# Patient Record
Sex: Female | Born: 1995 | Race: White | Hispanic: No | Marital: Single | Smoking: Never smoker
Health system: Southern US, Community
[De-identification: ages and names within clinical notes are randomized; demographics above are authoritative.]

## PROBLEM LIST (undated history)

## (undated) DIAGNOSIS — N200 Calculus of kidney: Secondary | ICD-10-CM

## (undated) DIAGNOSIS — F32A Depression, unspecified: Secondary | ICD-10-CM

## (undated) DIAGNOSIS — F329 Major depressive disorder, single episode, unspecified: Secondary | ICD-10-CM

## (undated) DIAGNOSIS — F419 Anxiety disorder, unspecified: Secondary | ICD-10-CM

## (undated) DIAGNOSIS — F4010 Social phobia, unspecified: Secondary | ICD-10-CM

---
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
## 2016-01-15 ENCOUNTER — Emergency Department: Payer: Medicaid Other

## 2016-01-15 ENCOUNTER — Emergency Department
Admission: EM | Admit: 2016-01-15 | Discharge: 2016-01-15 | Disposition: A | Discharge status: Home / Self Care | Payer: Medicaid Other | Attending: Emergency Medicine | Admitting: Emergency Medicine

## 2016-01-15 DIAGNOSIS — Z791 Long term (current) use of non-steroidal anti-inflammatories (NSAID): Secondary | ICD-10-CM | POA: Insufficient documentation

## 2016-01-15 DIAGNOSIS — Z5181 Encounter for therapeutic drug level monitoring: Secondary | ICD-10-CM | POA: Diagnosis not present

## 2016-01-15 DIAGNOSIS — N200 Calculus of kidney: Secondary | ICD-10-CM

## 2016-01-15 DIAGNOSIS — R1030 Lower abdominal pain, unspecified: Secondary | ICD-10-CM | POA: Diagnosis present

## 2016-01-15 DIAGNOSIS — N202 Calculus of kidney with calculus of ureter: Secondary | ICD-10-CM | POA: Insufficient documentation

## 2016-01-15 LAB — CBC
HCT: 41.3 % (ref 35.0–47.0)
Hemoglobin: 14.3 g/dL (ref 12.0–16.0)
MCH: 30.3 pg (ref 26.0–34.0)
MCHC: 34.6 g/dL (ref 32.0–36.0)
MCV: 87.6 fL (ref 80.0–100.0)
PLATELETS: 312 10*3/uL (ref 150–440)
RBC: 4.71 MIL/uL (ref 3.80–5.20)
RDW: 13.1 % (ref 11.5–14.5)
WBC: 11.8 10*3/uL — ABNORMAL HIGH (ref 3.6–11.0)

## 2016-01-15 LAB — POCT PREGNANCY, URINE: PREG TEST UR: NEGATIVE

## 2016-01-15 LAB — COMPREHENSIVE METABOLIC PANEL
ALT: 12 U/L — AB (ref 14–54)
AST: 22 U/L (ref 15–41)
Albumin: 5.2 g/dL — ABNORMAL HIGH (ref 3.5–5.0)
Alkaline Phosphatase: 64 U/L (ref 38–126)
Anion gap: 11 (ref 5–15)
BUN: 13 mg/dL (ref 6–20)
CHLORIDE: 106 mmol/L (ref 101–111)
CO2: 22 mmol/L (ref 22–32)
CREATININE: 0.88 mg/dL (ref 0.44–1.00)
Calcium: 10.2 mg/dL (ref 8.9–10.3)
GFR calc Af Amer: >60 mL/min (ref >60)
GLUCOSE: 113 mg/dL — AB (ref 65–99)
Potassium: 3.7 mmol/L (ref 3.5–5.1)
SODIUM: 139 mmol/L (ref 135–145)
Total Bilirubin: 0.2 mg/dL — ABNORMAL LOW (ref 0.3–1.2)
Total Protein: 8 g/dL (ref 6.5–8.1)

## 2016-01-15 LAB — URINE DRUG SCREEN, QUALITATIVE (ARMC ONLY)
Amphetamines, Ur Screen: NOT DETECTED
BARBITURATES, UR SCREEN: NOT DETECTED
Benzodiazepine, Ur Scrn: NOT DETECTED
COCAINE METABOLITE, UR ~~LOC~~: NOT DETECTED
Cannabinoid 50 Ng, Ur ~~LOC~~: POSITIVE — AB
MDMA (ECSTASY) UR SCREEN: NOT DETECTED
METHADONE SCREEN, URINE: NOT DETECTED
Opiate, Ur Screen: NOT DETECTED
Phencyclidine (PCP) Ur S: NOT DETECTED
TRICYCLIC, UR SCREEN: NOT DETECTED

## 2016-01-15 LAB — URINALYSIS COMPLETE WITH MICROSCOPIC (ARMC ONLY)
BILIRUBIN URINE: NEGATIVE
Bacteria, UA: NONE SEEN
GLUCOSE, UA: NEGATIVE mg/dL
Hgb urine dipstick: NEGATIVE
Leukocytes, UA: NEGATIVE
Nitrite: NEGATIVE
Protein, ur: 30 mg/dL — AB
Specific Gravity, Urine: 1.048 — ABNORMAL HIGH (ref 1.005–1.030)
pH: 5 (ref 5.0–8.0)

## 2016-01-15 MED ORDER — ONDANSETRON HCL 4 MG/2ML IJ SOLN
4.0000 mg | Freq: Once | INTRAMUSCULAR | Status: AC
  Administered 2016-01-15: 4 mg via INTRAVENOUS
  Filled 2016-01-15: qty 2

## 2016-01-15 MED ORDER — ONDANSETRON HCL 4 MG PO TABS: 4.0000 mg | ORAL_TABLET | Freq: Every day | ORAL | 0 refills | Status: AC | PRN

## 2016-01-15 MED ORDER — MORPHINE SULFATE (PF) 4 MG/ML IV SOLN
4.0000 mg | Freq: Once | INTRAVENOUS | Status: AC
  Administered 2016-01-15: 4 mg via INTRAVENOUS
  Filled 2016-01-15: qty 1

## 2016-01-15 MED ORDER — TAMSULOSIN HCL 0.4 MG PO CAPS: 0.4000 mg | ORAL_CAPSULE | Freq: Every day | ORAL | 0 refills | Status: AC

## 2016-01-15 MED ORDER — SODIUM CHLORIDE 0.9 % IV BOLUS (SEPSIS)
1000.0000 mL | Freq: Once | INTRAVENOUS | Status: AC
  Administered 2016-01-15: 1000 mL via INTRAVENOUS

## 2016-01-15 MED ORDER — HYDROCODONE-ACETAMINOPHEN 5-325 MG PO TABS: 1.0000 | ORAL_TABLET | ORAL | 0 refills | Status: AC | PRN

## 2016-01-15 NOTE — ED Provider Notes (Signed)
[facility] Emergency Department Provider Note  Time seen: 1:49 PM  I have reviewed the triage vital signs and the nursing notes.   HISTORY  Chief Complaint Back Pain and Nausea    HPI Monica Hammond is a 20 y.o. female with a past medical history of anxiety, depression, ureteral lithiasis, who presents the emergency department with sudden onset left flank pain. According to the patient approximately 3 hours ago she developed sudden onset sharp severe left flank pain as well as suprapubic pain. Patient states this feels nearly identical to the past kidney stone she experienced approximately 9 months ago. States some nausea but denies vomiting. Denies diarrhea or fever. Denies dysuria or hematuria. Denies vaginal bleeding or discharge. Describes the pain as moderate to severe sharp, left flank pain.  Past Medical History:  Diagnosis Date  . Anxiety   . Depression   . Kidney stone   . Social anxiety disorder     There are no active problems to display for this patient.   History reviewed. No pertinent surgical history.  Prior to Admission medications   Medication Sig Start Date End Date Taking? Authorizing Provider  naproxen (NAPROSYN) 500 MG tablet Take 1 tablet (500 mg total) by mouth 2 (two) times daily with a meal. 04/29/15   Sharman Cheek, MD  ondansetron (ZOFRAN) 4 MG tablet Take 1 tablet (4 mg total) by mouth daily as needed for nausea or vomiting. Patient not taking: Reported on 04/29/2015 04/24/15 04/23/16  Darci Current, MD  oxyCODONE-acetaminophen (ROXICET) 5-325 MG tablet Take 1 tablet by mouth every 4 (four) hours as needed for severe pain. Patient not taking: Reported on 04/29/2015 04/24/15   Darci Current, MD  phenazopyridine (PYRIDIUM) 200 MG tablet Take 1 tablet (200 mg total) by mouth 3 (three) times daily as needed for pain. 04/29/15   Sharman Cheek, MD  ranitidine (ZANTAC) 150 MG capsule Take 1 capsule (150 mg total) by  mouth 2 (two) times daily. 04/29/15   Sharman Cheek, MD  tamsulosin (FLOMAX) 0.4 MG CAPS capsule Take 0.4 mg by mouth daily. For 5 days 04/25/15   Historical Provider, MD  tamsulosin (FLOMAX) 0.4 MG CAPS capsule Take 1 capsule (0.4 mg total) by mouth daily. 04/29/15   Sharman Cheek, MD    Allergies  Allergen Reactions  . Oxycodone-Acetaminophen Rash    No family history on file.  Social History Social History  Substance Use Topics  . Smoking status: Never Smoker  . Smokeless tobacco: Never Used  . Alcohol use No    Review of Systems Constitutional: Negative for fever. Cardiovascular: Negative for chest pain. Respiratory: Negative for shortness of breath. Gastrointestinal: Left flank pain. Genitourinary: Negative for dysuria. Musculoskeletal: Negative for back pain. Neurological: Negative for headaches, focal weakness or numbness. 10-point ROS otherwise negative.  ____________________________________________   PHYSICAL EXAM:  VITAL SIGNS: ED Triage Vitals [01/15/16 1205]  Enc Vitals Group     BP 122/82     Pulse Rate (!) 117     Resp (!) 22     Temp 97.6 F (36.4 C)     Temp Source Oral     SpO2 100 %     Weight 95 lb (43.1 kg)     Height      Head Circumference      Peak Flow      Pain Score 6     Pain Loc      Pain Edu?      Excl. in  GC?     Constitutional: Alert and oriented. Well appearing and in no distress. Eyes: Normal exam ENT   Head: Normocephalic and atraumatic.   Mouth/Throat: Mucous membranes are moist. Cardiovascular: Normal rate, regular rhythm. No murmur Respiratory: Normal respiratory effort without tachypnea nor retractions. Breath sounds are clear Gastrointestinal: Soft, mild left-sided abdominal tenderness palpation as well as suprapubic tenderness to palpation. No rebound or guarding. Moderate left CVA tenderness palpation. Musculoskeletal: Nontender with normal range of motion in all extremities.  Neurologic:  Normal  speech and language. No gross focal neurologic deficits are appreciated. Skin:  Skin is warm, dry and intact.  Psychiatric: Mood and affect are normal. Speech and behavior are normal.   ____________________________________________   RADIOLOGY  CT shows left distal ureteral stone  ____________________________________________   INITIAL IMPRESSION / ASSESSMENT AND PLAN / ED COURSE  Pertinent labs & imaging results that were available during my care of the patient were reviewed by me and considered in my medical decision making (see chart for details).  The patient presents emergency department with left flank pain starting this morning around 11:00. Currently states moderate sharp left-sided flank pain. Has a history of kidney stones with a 2 mm right UVJ stone diagnosed in November 2016. We will check labs, CT renal scan, treat pain and nausea and IV hydrate while awaiting results. Patient agreeable to plan.  CT consistent with left distal ureteral stone on my read. We will discharge with Norco, Zofran, and urology follow-up. I discussed return precautions with the patient is agreeable.  ____________________________________________   FINAL CLINICAL IMPRESSION(S) / ED DIAGNOSES  Left flank pain Ureterolithiasis   Minna Antis, MD 01/15/16 1512

## 2016-01-15 NOTE — ED Triage Notes (Addendum)
Pt arrives to ER via [facility] from home c/o mid lower back pain that began at 11PM yesterday. Pt denies any injury associated with pain. Pt states hx of this pain approx 1 month ago; seen at [facility] and dx with "possible ovarian cyst", pt reports abdominal pain associated with that episode. Pt reports that she took an Excedrin this AM and also one of her "dad's pills, has THC in it". Pt states that she takes her dad's prescribed pills approx once every 3-4 months because "it helps me sleep off pain and headaches". Took pill at 630AM today. Pt reports that the pill makes her feel "fuzzy headed". Nausea, no vomiting. Denies abdominal pain at this time. Pt restless while sitting in chair.  Pt reports pain is constant but intermittently "spreads". Pt alert and oriented X4, active, cooperative. RR even and unlabored, color WNL.

## 2016-01-15 NOTE — ED Notes (Signed)
Patient states that she started having lower back and left side pain today around 11 or 12. Patient has a history of kidney stones. Patient states that she is having some burning with urination. Denies vaginal bleeding or discharge.

## 2017-08-21 ENCOUNTER — Emergency Department
Admission: EM | Admit: 2017-08-21 | Discharge: 2017-08-21 | Disposition: A | Discharge status: Home / Self Care | Payer: Medicaid Other | Attending: Emergency Medicine | Admitting: Emergency Medicine

## 2017-08-21 ENCOUNTER — Encounter: Payer: Self-pay | Admitting: Emergency Medicine

## 2017-08-21 DIAGNOSIS — Z72820 Sleep deprivation: Secondary | ICD-10-CM | POA: Diagnosis not present

## 2017-08-21 DIAGNOSIS — R410 Disorientation, unspecified: Secondary | ICD-10-CM | POA: Insufficient documentation

## 2017-08-21 DIAGNOSIS — Z79899 Other long term (current) drug therapy: Secondary | ICD-10-CM | POA: Diagnosis not present

## 2017-08-21 DIAGNOSIS — R079 Chest pain, unspecified: Secondary | ICD-10-CM | POA: Diagnosis present

## 2017-08-21 LAB — CBC
HCT: 42.5 % (ref 35.0–47.0)
Hemoglobin: 14.1 g/dL (ref 12.0–16.0)
MCH: 31.2 pg (ref 26.0–34.0)
MCHC: 33.1 g/dL (ref 32.0–36.0)
MCV: 94.3 fL (ref 80.0–100.0)
PLATELETS: 342 10*3/uL (ref 150–440)
RBC: 4.5 MIL/uL (ref 3.80–5.20)
RDW: 12.8 % (ref 11.5–14.5)
WBC: 6.5 10*3/uL (ref 3.6–11.0)

## 2017-08-21 LAB — COMPREHENSIVE METABOLIC PANEL
ALT: 63 U/L — AB (ref 14–54)
ANION GAP: 9 (ref 5–15)
AST: 26 U/L (ref 15–41)
Albumin: 4.6 g/dL (ref 3.5–5.0)
Alkaline Phosphatase: 61 U/L (ref 38–126)
BUN: 8 mg/dL (ref 6–20)
CHLORIDE: 102 mmol/L (ref 101–111)
CO2: 26 mmol/L (ref 22–32)
Calcium: 9.5 mg/dL (ref 8.9–10.3)
Creatinine, Ser: 0.5 mg/dL (ref 0.44–1.00)
Glucose, Bld: 88 mg/dL (ref 65–99)
Potassium: 3.8 mmol/L (ref 3.5–5.1)
SODIUM: 137 mmol/L (ref 135–145)
Total Bilirubin: 0.7 mg/dL (ref 0.3–1.2)
Total Protein: 7.7 g/dL (ref 6.5–8.1)

## 2017-08-21 LAB — URINALYSIS, COMPLETE (UACMP) WITH MICROSCOPIC
BILIRUBIN URINE: NEGATIVE
Bacteria, UA: NONE SEEN
GLUCOSE, UA: NEGATIVE mg/dL
Hgb urine dipstick: NEGATIVE
Ketones, ur: NEGATIVE mg/dL
Leukocytes, UA: NEGATIVE
NITRITE: NEGATIVE
PROTEIN: NEGATIVE mg/dL
Specific Gravity, Urine: 1.006 (ref 1.005–1.030)
pH: 7 (ref 5.0–8.0)

## 2017-08-21 LAB — LIPASE, BLOOD: LIPASE: 32 U/L (ref 11–51)

## 2017-08-21 LAB — POCT PREGNANCY, URINE: Preg Test, Ur: NEGATIVE

## 2017-08-21 LAB — TROPONIN I

## 2017-08-21 NOTE — ED Provider Notes (Signed)
[facility] Emergency Department Provider Note  Time seen: 2:46 PM  I have reviewed the triage vital signs and the nursing notes.   HISTORY  Chief Complaint Abdominal Pain; Nausea; and Tingling    HPI Monica Hammond is a 22 y.o. female with a past medical history of anxiety, depression, presents to the emergency department for various complaints of chest discomfort abdominal cramping, difficulty speaking and confusion.  According to the patient she took Tylenol PM last night but could not fall asleep.  Around 1 or 2:00 in the morning she felt like she could not find the right words when she was trying to talk and felt somewhat confused.  States she is still not slept, this morning she felt nauseated, and had one episode of diarrhea.  Patient states she is experiencing some chest discomfort this morning somewhat into her left arm as well.  Denies any chest discomfort currently continues today mild abdominal cramping.  Denies any cough, congestion fever or dysuria.   Past Medical History:  Diagnosis Date  . Anxiety   . Depression   . Kidney stone   . Social anxiety disorder     There are no active problems to display for this patient.   History reviewed. No pertinent surgical history.  Prior to Admission medications   Medication Sig Start Date End Date Taking? Authorizing Provider  naproxen (NAPROSYN) 500 MG tablet Take 1 tablet (500 mg total) by mouth 2 (two) times daily with a meal. 04/29/15   Sharman Cheek, MD  ondansetron (ZOFRAN) 4 MG tablet Take 1 tablet (4 mg total) by mouth daily as needed for nausea or vomiting. 01/15/16   Minna Antis, MD  oxyCODONE-acetaminophen (ROXICET) 5-325 MG tablet Take 1 tablet by mouth every 4 (four) hours as needed for severe pain. Patient not taking: Reported on 04/29/2015 04/24/15   Darci Current, MD  phenazopyridine (PYRIDIUM) 200 MG tablet Take 1 tablet (200 mg total) by mouth 3 (three) times daily as  needed for pain. 04/29/15   Sharman Cheek, MD  ranitidine (ZANTAC) 150 MG capsule Take 1 capsule (150 mg total) by mouth 2 (two) times daily. 04/29/15   Sharman Cheek, MD  tamsulosin (FLOMAX) 0.4 MG CAPS capsule Take 1 capsule (0.4 mg total) by mouth daily. 01/15/16   Minna Antis, MD    Allergies  Allergen Reactions  . Oxycodone-Acetaminophen Rash    No family history on file.  Social History Social History   Tobacco Use  . Smoking status: Never Smoker  . Smokeless tobacco: Never Used  Substance Use Topics  . Alcohol use: No  . Drug use: No    Review of Systems Constitutional: Negative for fever. Eyes: Negative for visual complaints ENT: Negative for recent illness/congestion Cardiovascular: Mild chest discomfort this morning. Respiratory: Negative for shortness of breath. Gastrointestinal: Abdominal cramping.  Positive for nausea, negative for vomiting but one episode of diarrhea. Genitourinary: Negative for urinary compaints Musculoskeletal: Negative for musculoskeletal complaints Skin: Negative for skin complaints  Neurological: Negative for headache.  Negative for weakness or numbness.  States she was having trouble finding the right words this morning and felt somewhat confused. All other ROS negative  ____________________________________________   PHYSICAL EXAM:  VITAL SIGNS: ED Triage Vitals  Enc Vitals Group     BP 08/21/17 1324 113/69     Pulse Rate 08/21/17 1324 (!) 105     Resp 08/21/17 1324 20     Temp 08/21/17 1324 98.1 F (36.7 C)  Temp Source 08/21/17 1324 Oral     SpO2 08/21/17 1324 100 %     Weight 08/21/17 1325 110 lb (49.9 kg)     Height 08/21/17 1325 5\' 2"  (1.575 m)     Head Circumference --      Peak Flow --      Pain Score 08/21/17 1324 6     Pain Loc --      Pain Edu? --      Excl. in GC? --     Constitutional: Alert and oriented. Well appearing and in no distress. Eyes: Normal exam ENT   Head: Normocephalic and  atraumatic.   Mouth/Throat: Mucous membranes are moist. Cardiovascular: Normal rate, regular rhythm. No murmur Respiratory: Normal respiratory effort without tachypnea nor retractions. Breath sounds are clear Gastrointestinal: Soft, nondistended, thin abdomen.  States mild discomfort in all quadrants but no focal tenderness identified.  No rebound or guarding. Musculoskeletal: Nontender with normal range of motion in all extremities Neurologic:  Normal speech and language. No gross focal neurologic deficits.  Equal grip strengths.  No pronator drift.  Good lower extremity strength.  No difficulty ambulating. Skin:  Skin is warm, dry and intact.  Psychiatric: Somewhat anxious appearing.  ____________________________________________    EKG  EKG reviewed and interpreted by myself shows normal sinus rhythm at 96 bpm with a narrow QRS, normal axis, normal intervals, no ST changes.  Normal EKG.  ____________________________________________  INITIAL IMPRESSION / ASSESSMENT AND PLAN / ED COURSE  Pertinent labs & imaging results that were available during my care of the patient were reviewed by me and considered in my medical decision making (see chart for details).  Patient presents to the emergency department for various complaints of chest discomfort which is since resolved, abdominal cramping with nausea and one episode of diarrhea as well as difficulty finding the right words and feeling somewhat confused around 1 or 2:00 this morning.  Differential would include chest wall pain, ACS, anxiety, gastroenteritis, urinary tract infection, less likely CVA, more likely medication induced symptoms.  Patient admits to taking Tylenol PM but states she never could fall asleep, noted the difficulty speaking and confusion around 1 or 2:00 in the morning after taking the medication but still not sleeping.  I highly suspect the symptoms are likely medication induced.  Patient has a normal/intact  neurological exam currently.  We will check labs and continue to closely monitor in the emergency department.  Patient agreeable to this plan of care.  Patient's workup is essentially normal.  Urinalysis negative.  Pregnancy test negative.  Labs are within normal limits including negative troponin.  Reassuring EKG.  Highly suspect the patient's symptoms are due to taking a sleeping aid along with sleep deprivation.  I discussed with the patient obtaining plenty of rest today following up with her doctor.  I also discussed return precautions for any further weakness, numbness confusion or slurred speech.  Patient agreeable to plan.  ____________________________________________   FINAL CLINICAL IMPRESSION(S) / ED DIAGNOSES  Sleep deprivation Confusion    Minna AntisPaduchowski, Rithvik Orcutt, MD 08/21/17 1609

## 2017-08-21 NOTE — ED Triage Notes (Signed)
No facial drooping noted. Pt ambulatory with normal gait.

## 2017-08-21 NOTE — ED Triage Notes (Signed)
Pt reports last night at around 20:00 started with nausea, abdominal pain, tingling in her left arm and not being able to find words. Pt reports she also could not sleep last night and had some diarrhea. Pt denies SOB, CP, or other sx's.

## 2017-12-27 ENCOUNTER — Emergency Department
Admission: EM | Admit: 2017-12-27 | Discharge: 2017-12-27 | Disposition: A | Discharge status: Home / Self Care | Payer: Self-pay | Attending: Emergency Medicine | Admitting: Emergency Medicine

## 2017-12-27 ENCOUNTER — Encounter: Payer: Self-pay | Admitting: Emergency Medicine

## 2017-12-27 ENCOUNTER — Other Ambulatory Visit: Payer: Self-pay

## 2017-12-27 ENCOUNTER — Emergency Department: Payer: Self-pay

## 2017-12-27 DIAGNOSIS — R55 Syncope and collapse: Secondary | ICD-10-CM | POA: Insufficient documentation

## 2017-12-27 DIAGNOSIS — E876 Hypokalemia: Secondary | ICD-10-CM | POA: Insufficient documentation

## 2017-12-27 DIAGNOSIS — Z79899 Other long term (current) drug therapy: Secondary | ICD-10-CM | POA: Insufficient documentation

## 2017-12-27 LAB — URINALYSIS, COMPLETE (UACMP) WITH MICROSCOPIC
BACTERIA UA: NONE SEEN
Bilirubin Urine: NEGATIVE
GLUCOSE, UA: NEGATIVE mg/dL
Hgb urine dipstick: NEGATIVE
KETONES UR: NEGATIVE mg/dL
LEUKOCYTES UA: NEGATIVE
Nitrite: NEGATIVE
PROTEIN: NEGATIVE mg/dL
Specific Gravity, Urine: 1.012 (ref 1.005–1.030)
pH: 6 (ref 5.0–8.0)

## 2017-12-27 LAB — BASIC METABOLIC PANEL
ANION GAP: 10 (ref 5–15)
BUN: 9 mg/dL (ref 6–20)
CHLORIDE: 102 mmol/L (ref 98–111)
CO2: 24 mmol/L (ref 22–32)
Calcium: 9 mg/dL (ref 8.9–10.3)
Creatinine, Ser: 0.74 mg/dL (ref 0.44–1.00)
GFR calc Af Amer: >60 mL/min (ref >60)
GFR calc non Af Amer: >60 mL/min (ref >60)
Glucose, Bld: 128 mg/dL — ABNORMAL HIGH (ref 70–99)
POTASSIUM: 3 mmol/L — AB (ref 3.5–5.1)
SODIUM: 136 mmol/L (ref 135–145)

## 2017-12-27 LAB — CBC
HEMATOCRIT: 41 % (ref 35.0–47.0)
HEMOGLOBIN: 14.1 g/dL (ref 12.0–16.0)
MCH: 30.9 pg (ref 26.0–34.0)
MCHC: 34.3 g/dL (ref 32.0–36.0)
MCV: 90.2 fL (ref 80.0–100.0)
PLATELETS: 257 10*3/uL (ref 150–440)
RBC: 4.55 MIL/uL (ref 3.80–5.20)
RDW: 12.7 % (ref 11.5–14.5)
WBC: 7.9 10*3/uL (ref 3.6–11.0)

## 2017-12-27 LAB — TROPONIN I

## 2017-12-27 LAB — ACETAMINOPHEN LEVEL: ACETAMINOPHEN (TYLENOL), SERUM: 27 ug/mL (ref 10–30)

## 2017-12-27 LAB — POCT PREGNANCY, URINE
PREG TEST UR: NEGATIVE
PREG TEST UR: NEGATIVE

## 2017-12-27 MED ORDER — BACITRACIN ZINC 500 UNIT/GM EX OINT
TOPICAL_OINTMENT | Freq: Once | CUTANEOUS | Status: AC
  Administered 2017-12-27: 1 via TOPICAL
  Filled 2017-12-27: qty 0.9

## 2017-12-27 MED ORDER — POTASSIUM CHLORIDE 20 MEQ PO PACK
40.0000 meq | PACK | Freq: Two times a day (BID) | ORAL | Status: DC
  Administered 2017-12-27: 40 meq via ORAL
  Filled 2017-12-27: qty 2

## 2017-12-27 MED ORDER — ONDANSETRON HCL 4 MG/2ML IJ SOLN
INTRAMUSCULAR | Status: AC
  Administered 2017-12-27: 4 mg via INTRAVENOUS
  Filled 2017-12-27: qty 2

## 2017-12-27 MED ORDER — ONDANSETRON HCL 4 MG/2ML IJ SOLN
4.0000 mg | Freq: Once | INTRAMUSCULAR | Status: AC | PRN
  Administered 2017-12-27: 4 mg via INTRAVENOUS

## 2017-12-27 MED ORDER — AMOXICILLIN-POT CLAVULANATE 875-125 MG PO TABS: 1.0000 | ORAL_TABLET | Freq: Two times a day (BID) | ORAL | 0 refills | Status: AC

## 2017-12-27 MED ORDER — SODIUM CHLORIDE 0.9 % IV BOLUS
1000.0000 mL | Freq: Once | INTRAVENOUS | Status: AC
  Administered 2017-12-27: 1000 mL via INTRAVENOUS

## 2017-12-27 NOTE — ED Notes (Signed)
This RN introduced self to patient and her mother. Pt resting in bed. Medications administered per MD order. Pt denies any complaints at this time. Will continue to monitor for further patient needs, explained to patient/mother to call out when fluids are finished if fluids complete prior to this RN returning. Pt and mother state understanding.

## 2017-12-27 NOTE — ED Notes (Signed)
Dr. Brown at the bedside for pt evaluation 

## 2017-12-27 NOTE — ED Notes (Signed)
Pt to CT

## 2017-12-27 NOTE — ED Notes (Signed)
NAD noted at time of D/C. Pt denies questions or concerns. Pt ambulatory to the lobby at this time with her family.

## 2017-12-27 NOTE — ED Notes (Signed)
Patient vomited x1 in triage. Zofran given per protocol.

## 2017-12-27 NOTE — ED Triage Notes (Signed)
Patient to ER for c/o syncopal episode at home in bathroom. States "I've felt off all day, but then I got dizzy and passed out.". Patient pale in triage. Has lac to bottom lip approx 1 inch in length with bleeding controlled. Patient states she took 3 Tylenol PM prior to incident (approx 1.5 hours prior), but that she has h/o taking up to 8 Tylenol PM tabs at one time to help her sleep. Denies feeling drowsy at time of syncope/fall.

## 2017-12-27 NOTE — ED Provider Notes (Signed)
[facility] Emergency Department Provider Note ____   First MD Initiated Contact with Patient 12/27/17 [phone]     (approximate)  I have reviewed the triage vital signs and the nursing notes.   HISTORY  Chief Complaint Loss of Consciousness and Facial Laceration    HPI Monica Hammond is a 22 y.o. female with below list of chronic medical conditions presents to the emergency department following a syncopal episode.  Per the patient mother child fell forward striking her lower lip and then subsequently striking the posterior portion of her head.  Patient admits to preceding dizziness.  Patient admits to taking 3 Tylenol PM's before the episode.  Patient states that she takes Tylenol PM as a sleep aid and has taken as many as 8 at one time for sleep.  Patient admits to vomiting and diarrhea today.  In addition the patient's mother states that their air conditioning unit is not working and as such is very hot in the home.   Past Medical History:  Diagnosis Date  . Anxiety   . Depression   . Kidney stone   . Social anxiety disorder     There are no active problems to display for this patient.   History reviewed. No pertinent surgical history.  Prior to Admission medications   Medication Sig Start Date End Date Taking? Authorizing Provider  naproxen (NAPROSYN) 500 MG tablet Take 1 tablet (500 mg total) by mouth 2 (two) times daily with a meal. 04/29/15   Sharman Cheek, MD  ondansetron (ZOFRAN) 4 MG tablet Take 1 tablet (4 mg total) by mouth daily as needed for nausea or vomiting. 01/15/16   Minna Antis, MD  oxyCODONE-acetaminophen (ROXICET) 5-325 MG tablet Take 1 tablet by mouth every 4 (four) hours as needed for severe pain. Patient not taking: Reported on 04/29/2015 04/24/15   Darci Current, MD  phenazopyridine (PYRIDIUM) 200 MG tablet Take 1 tablet (200 mg total) by mouth 3 (three) times daily as needed for pain. 04/29/15   Sharman Cheek, MD  ranitidine (ZANTAC) 150 MG capsule Take 1 capsule (150 mg total) by mouth 2 (two) times daily. 04/29/15   Sharman Cheek, MD  tamsulosin (FLOMAX) 0.4 MG CAPS capsule Take 1 capsule (0.4 mg total) by mouth daily. 01/15/16   Minna Antis, MD    Allergies Oxycodone-acetaminophen  No family history on file.  Social History Social History   Tobacco Use  . Smoking status: Never Smoker  . Smokeless tobacco: Never Used  Substance Use Topics  . Alcohol use: No  . Drug use: No    Review of Systems Constitutional: No fever/chills Eyes: No visual changes. ENT: No sore throat. Cardiovascular: Denies chest pain. Respiratory: Denies shortness of breath. Gastrointestinal: No abdominal pain.  No nausea, no vomiting.  No diarrhea.  No constipation. Genitourinary: Negative for dysuria. Musculoskeletal: Negative for neck pain.  Negative for back pain. Integumentary: Negative for rash. Neurological: Negative for headaches, focal weakness or numbness.  Positive for syncopal episode   ____________________________________________   PHYSICAL EXAM:  VITAL SIGNS: ED Triage Vitals  Enc Vitals Group     BP 12/27/17 0307 (!) 95/58     Pulse Rate 12/27/17 0307 (!) 105     Resp 12/27/17 0307 20     Temp 12/27/17 0307 98.5 F (36.9 C)     Temp Source 12/27/17 0307 Oral     SpO2 12/27/17 0307 100 %     Weight 12/27/17 0308 43.1 kg (95 lb)  Height 12/27/17 0308 1.575 m (5\' 2" )     Head Circumference --      Peak Flow --      Pain Score 12/27/17 0308 4     Pain Loc --      Pain Edu? --      Excl. in GC? --     Constitutional: Alert and oriented. Well appearing and in no acute distress. Eyes: Conjunctivae are normal. PERRL. EOMI. Head: Atraumatic. Mouth/Throat: Mucous membranes are moist.  Oropharynx non-erythematous.  Linear 2 cm laceration just inferior to the lower lip.  Laceration of the lip also noted on the inside of the mouth Neck: No stridor.     Cardiovascular: Normal rate, regular rhythm. Good peripheral circulation. Grossly normal heart sounds. Respiratory: Normal respiratory effort.  No retractions. Lungs CTAB. Gastrointestinal: Soft and nontender. No distention.  Musculoskeletal: No lower extremity tenderness nor edema. No gross deformities of extremities. Neurologic:  Normal speech and language. No gross focal neurologic deficits are appreciated.  Skin:  Skin is warm, dry and intact. No rash noted. Psychiatric: Mood and affect are normal. Speech and behavior are normal.  ____________________________________________   LABS (all labs ordered are listed, but only abnormal results are displayed)  Labs Reviewed  BASIC METABOLIC PANEL - Abnormal; Notable for the following components:      Result Value   Potassium 3.0 (*)    Glucose, Bld 128 (*)    All other components within normal limits  URINALYSIS, COMPLETE (UACMP) WITH MICROSCOPIC - Abnormal; Notable for the following components:   Color, Urine YELLOW (*)    APPearance HAZY (*)    All other components within normal limits  CBC  TROPONIN I  ACETAMINOPHEN LEVEL  POC URINE PREG, ED  POCT PREGNANCY, URINE  POCT PREGNANCY, URINE   ____________________________________________  EKG  ED ECG REPORT I, [place] Liesl Simons, the attending physician, personally viewed and interpreted this ECG.   Date: 12/27/2017  EKG Time: 3:12 AM  Rate: 79  Rhythm: Normal sinus rhythm  Axis: Normal  Intervals: Normal  ST&T Change: None  ____________________________________________  RADIOLOGY I, [place] Shawntina Diffee, personally viewed and evaluated these images (plain radiographs) as part of my medical decision making, as well as reviewing the written report by the radiologist.  ED MD interpretation:    Official radiology report(s): Ct Head Wo Contrast  Result Date: 12/27/2017 CLINICAL DATA:  22 y/o  F; syncopal episode at home in bathroom. EXAM: CT HEAD WITHOUT CONTRAST TECHNIQUE:  Contiguous axial images were obtained from the base of the skull through the vertex without intravenous contrast. COMPARISON:  09/01/2014 CT head FINDINGS: Brain: No evidence of acute infarction, hemorrhage, hydrocephalus, extra-axial collection or mass lesion/mass effect. Vascular: No hyperdense vessel or unexpected calcification. Skull: Normal. Negative for fracture or focal lesion. Sinuses/Orbits: No acute finding. Other: None. IMPRESSION: Stable normal CT of the head. No acute intracranial abnormality identified. Electronically Signed   By: Mitzi Hansen M.D.   On: 12/27/2017 06:43      Procedures   ____________________________________________   INITIAL IMPRESSION / ASSESSMENT AND PLAN / ED COURSE  As part of my medical decision making, I reviewed the following data within the electronic MEDICAL RECORD NUMBER   22 year old female presenting with above-stated history and physical exam secondary to syncopal episode with resultant laceration to the lip which was not repaired.  Patient given Augmentin in the emergency department will be prescribed the same for home secondary to lip laceration.  Suspect patient syncopal episode to  be secondary to orthostatic hypotension as confirmed by orthostatic vital signs.  CT scan of the head revealed no acute intracranial abnormality.  Nexus criteria cleared.    ____________________________________________  FINAL CLINICAL IMPRESSION(S) / ED DIAGNOSES  Final diagnoses:  Syncope, unspecified syncope type  Hypokalemia     MEDICATIONS GIVEN DURING THIS VISIT:  Medications  sodium chloride 0.9 % bolus 1,000 mL (1,000 mLs Intravenous New Bag/Given 12/27/17 0647)  ondansetron (ZOFRAN) injection 4 mg (4 mg Intravenous Given 12/27/17 0321)  sodium chloride 0.9 % bolus 1,000 mL (0 mLs Intravenous Stopped 12/27/17 0447)     ED Discharge Orders    None       Note:  This document was prepared using Dragon voice recognition software and may  include unintentional dictation errors.    Darci CurrentBrown, Hope N, MD 12/27/17 2223

## 2017-12-27 NOTE — ED Provider Notes (Signed)
Patient reports she is feeling better and wants to go home.  She is cleared for outpatient follow-up.   Emily FilbertWilliams, Antwuan Eckley E, MD 12/27/17 [phone]

## 2020-03-13 IMAGING — CT CT HEAD W/O CM
3 series · 16 of 44 positions shown, 19 images · non-contrast
Comparison: 09/01/2014 CT head

CLINICAL DATA: 22 y/o  F; syncopal episode at home in bathroom.

EXAM:
CT HEAD WITHOUT CONTRAST
TECHNIQUE: Contiguous axial images were obtained from the base of the skull
through the vertex without intravenous contrast.

[Series 3: head wo · axial · 0.39mm/px · z∈[-111,-1]mm · 10 of 27 slices shown, 13 images]
[im 3/27  brain]
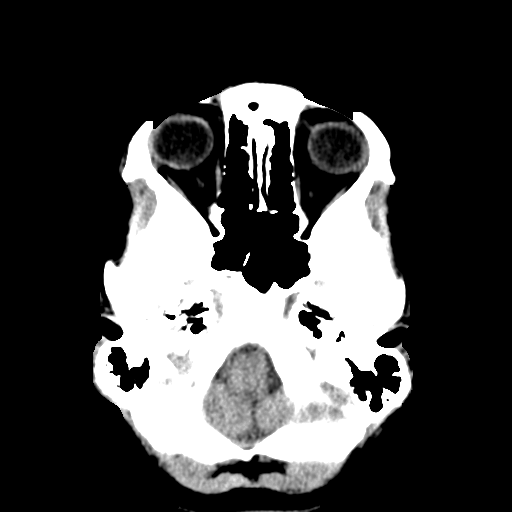
[im 3/27  bone]
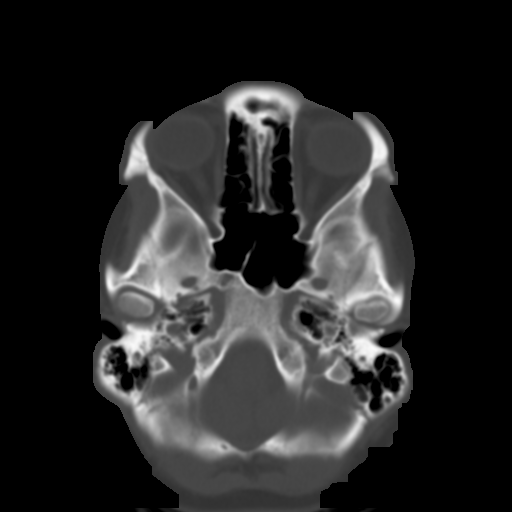
[im 5/27  brain]
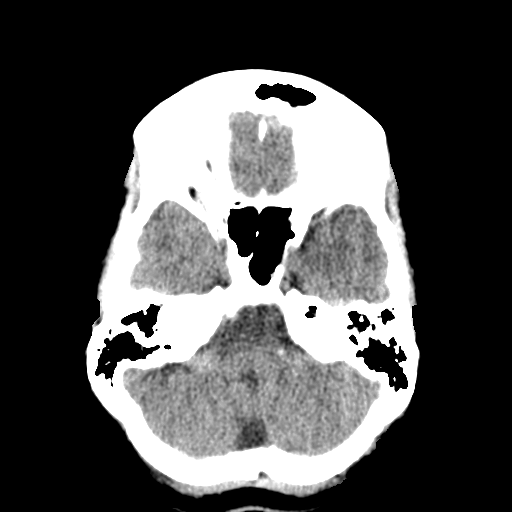
[im 8/27  brain]
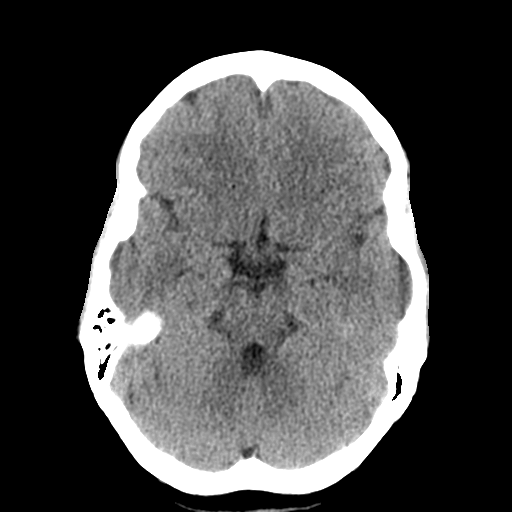
[im 10/27  brain]
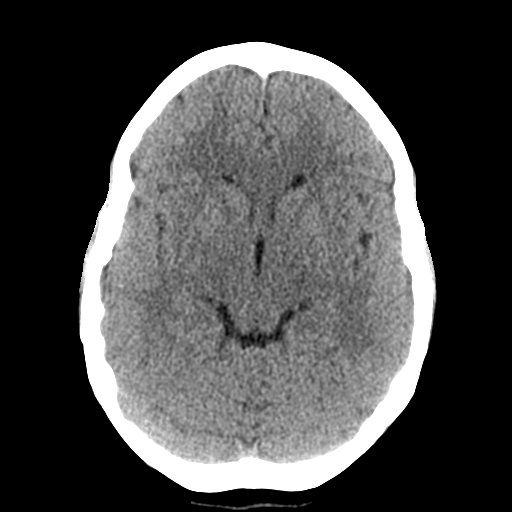
[im 13/27  brain]
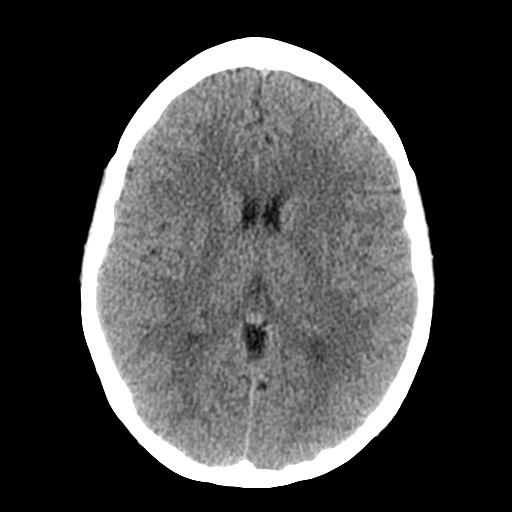
[im 13/27  bone]
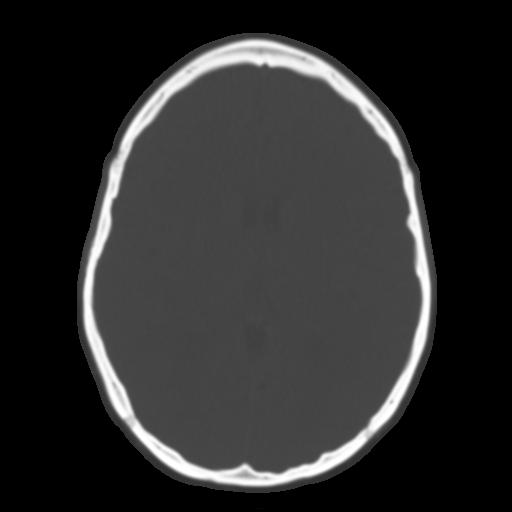
[im 15/27  brain]
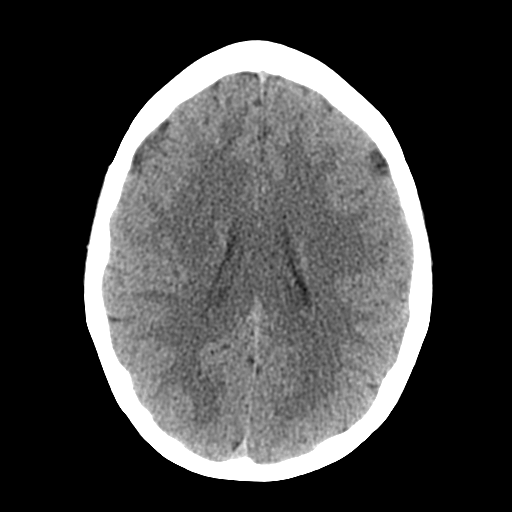
[im 18/27  brain]
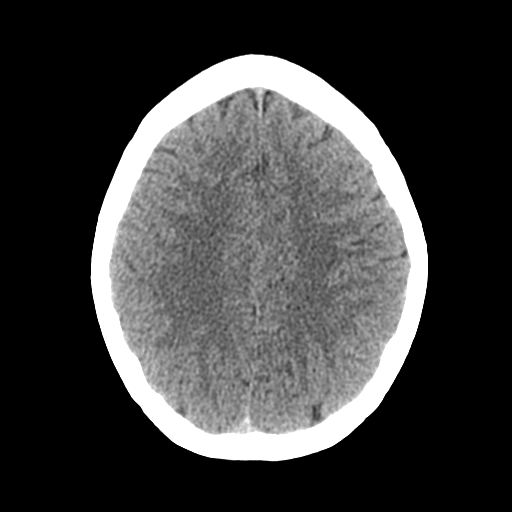
[im 20/27  brain]
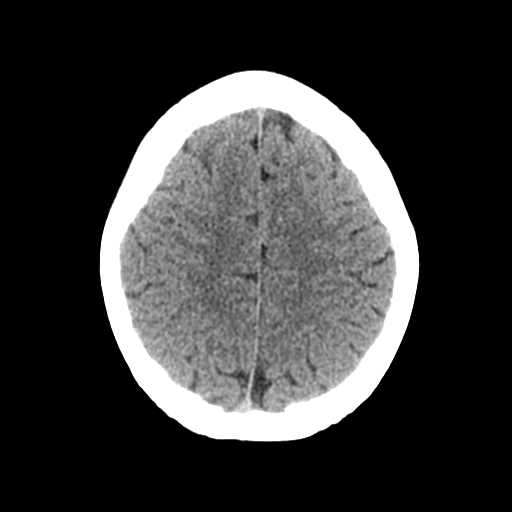
[im 23/27  brain]
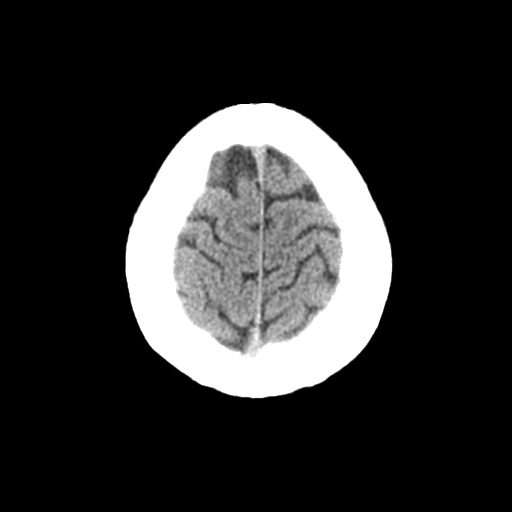
[im 23/27  bone]
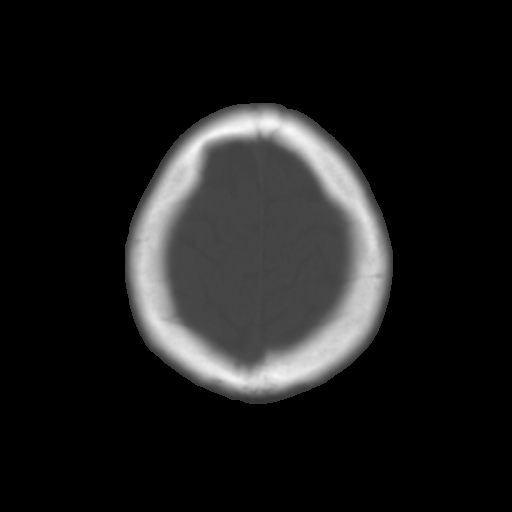
[im 25/27  brain]
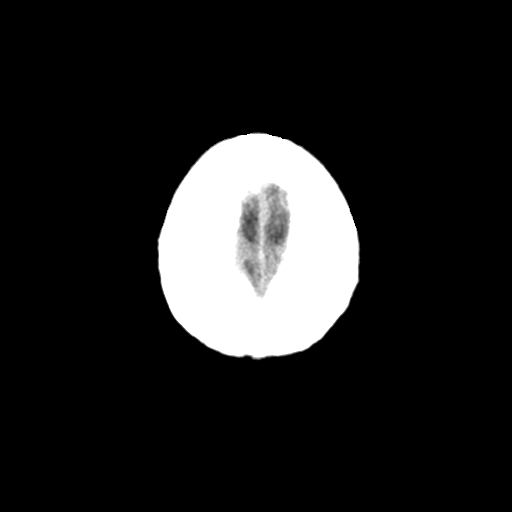

[Series 4: coronal soft tissue · coronal · 0.27mm/px · 3 of 60 slices shown]
[im 20/60  brain]
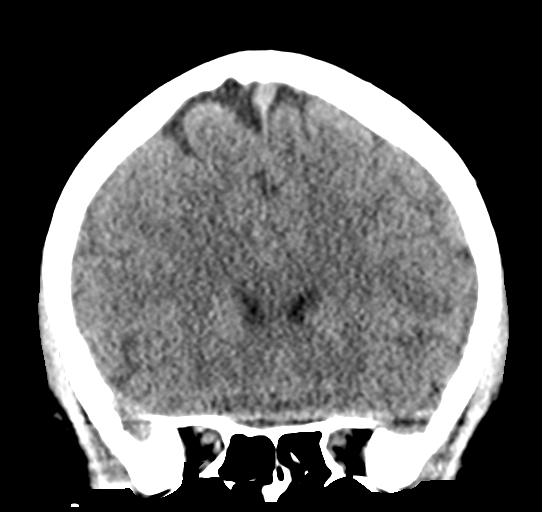
[im 27/60  brain]
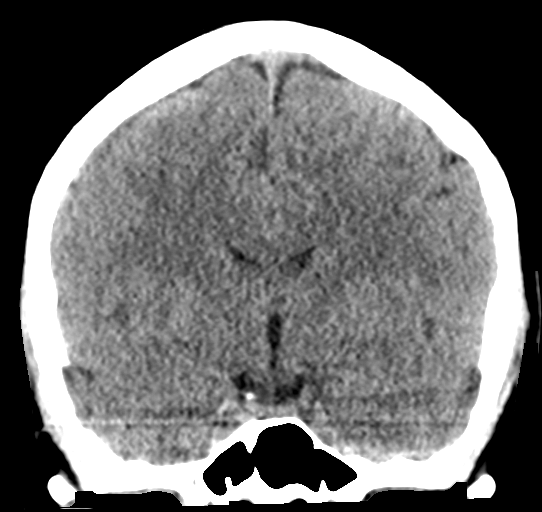
[im 33/60  brain]
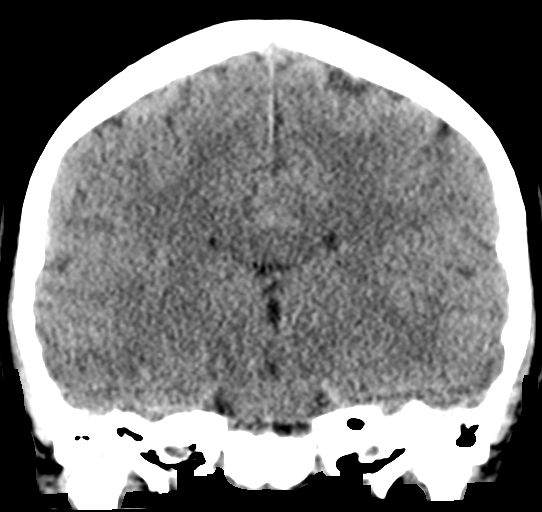

[Series 5: sagittal soft tissue · sagittal · 0.27mm/px · 3 of 49 slices shown]
[im 17/49  brain]
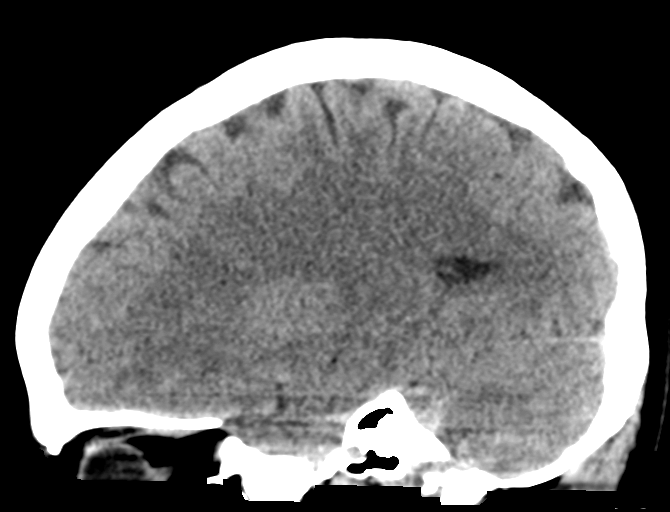
[im 25/49  brain]
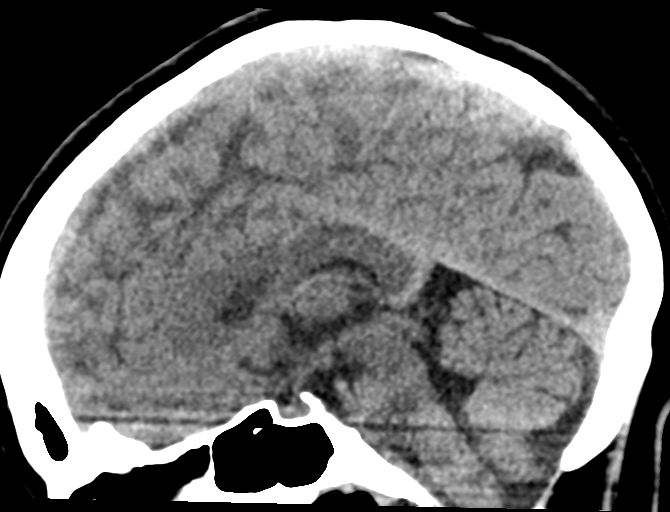
[im 33/49  brain]
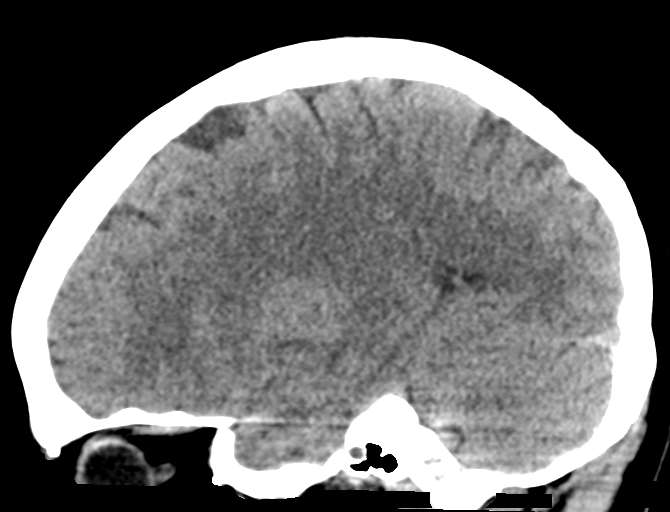

[16 of 44 positions shown; findings below may reference images not displayed]

FINDINGS: Brain: No evidence of acute infarction, hemorrhage, hydrocephalus,
extra-axial collection or mass lesion/mass effect.

Vascular: No hyperdense vessel or unexpected calcification.

Skull: Normal. Negative for fracture or focal lesion.

Sinuses/Orbits: No acute finding.

Other: None.
IMPRESSION: Stable normal CT of the head. No acute intracranial abnormality
identified.

By: Humdhoon Awaadh M.D.
